# Patient Record
Sex: Female | Born: 1945 | Race: White | Hispanic: No
Health system: Southern US, Community
[De-identification: ages and names within clinical notes are randomized; demographics above are authoritative.]

---

## 2018-07-08 ENCOUNTER — Observation Stay (HOSPITAL_COMMUNITY)
Admission: EM | Admit: 2018-07-08 | Discharge: 2018-07-08 | Disposition: A | Discharge status: Other Acute Inpatient Hospital | Payer: Medicare Other | Attending: Internal Medicine | Admitting: Internal Medicine

## 2018-07-08 ENCOUNTER — Emergency Department (HOSPITAL_COMMUNITY): Payer: Medicare Other

## 2018-07-08 DIAGNOSIS — Z9889 Other specified postprocedural states: Secondary | ICD-10-CM | POA: Insufficient documentation

## 2018-07-08 DIAGNOSIS — Z7982 Long term (current) use of aspirin: Secondary | ICD-10-CM | POA: Diagnosis not present

## 2018-07-08 DIAGNOSIS — N99840 Postprocedural hematoma of a genitourinary system organ or structure following a genitourinary system procedure: Secondary | ICD-10-CM | POA: Diagnosis not present

## 2018-07-08 DIAGNOSIS — Z95828 Presence of other vascular implants and grafts: Secondary | ICD-10-CM | POA: Insufficient documentation

## 2018-07-08 DIAGNOSIS — Z880 Allergy status to penicillin: Secondary | ICD-10-CM | POA: Diagnosis not present

## 2018-07-08 DIAGNOSIS — D649 Anemia, unspecified: Secondary | ICD-10-CM | POA: Diagnosis not present

## 2018-07-08 DIAGNOSIS — Z7901 Long term (current) use of anticoagulants: Secondary | ICD-10-CM | POA: Diagnosis not present

## 2018-07-08 DIAGNOSIS — E669 Obesity, unspecified: Secondary | ICD-10-CM | POA: Diagnosis not present

## 2018-07-08 DIAGNOSIS — N9489 Other specified conditions associated with female genital organs and menstrual cycle: Secondary | ICD-10-CM

## 2018-07-08 DIAGNOSIS — Z6841 Body Mass Index (BMI) 40.0 and over, adult: Secondary | ICD-10-CM | POA: Diagnosis not present

## 2018-07-08 LAB — URINALYSIS, ROUTINE W REFLEX MICROSCOPIC
Bilirubin Urine: NEGATIVE
Glucose, UA: NEGATIVE mg/dL
Ketones, ur: NEGATIVE mg/dL
Nitrite: NEGATIVE
Protein, ur: NEGATIVE mg/dL
Specific Gravity, Urine: 1.005 (ref 1.005–1.030)
pH: 6 (ref 5.0–8.0)

## 2018-07-08 LAB — BASIC METABOLIC PANEL
Anion gap: 10 (ref 5–15)
BUN: 30 mg/dL — ABNORMAL HIGH (ref 8–23)
CO2: 22 mmol/L (ref 22–32)
Calcium: 7.7 mg/dL — ABNORMAL LOW (ref 8.9–10.3)
Chloride: 101 mmol/L (ref 98–111)
Creatinine, Ser: 1.29 mg/dL — ABNORMAL HIGH (ref 0.44–1.00)
GFR calc Af Amer: 47 mL/min — ABNORMAL LOW (ref >60)
GFR calc non Af Amer: 41 mL/min — ABNORMAL LOW (ref >60)
Glucose, Bld: 81 mg/dL (ref 70–99)
Potassium: 3.7 mmol/L (ref 3.5–5.1)
Sodium: 133 mmol/L — ABNORMAL LOW (ref 135–145)

## 2018-07-08 LAB — CBC WITH DIFFERENTIAL/PLATELET
Abs Immature Granulocytes: 0.1 10*3/uL (ref 0.0–0.1)
Basophils Absolute: 0 10*3/uL (ref 0.0–0.1)
Basophils Relative: 0 %
Eosinophils Absolute: 0.5 10*3/uL (ref 0.0–0.7)
Eosinophils Relative: 7 %
HCT: 17.5 % — ABNORMAL LOW (ref 36.0–46.0)
Hemoglobin: 5.6 g/dL — CL (ref 12.0–15.0)
Immature Granulocytes: 1 %
Lymphocytes Relative: 12 %
Lymphs Abs: 0.8 10*3/uL (ref 0.7–4.0)
MCH: 28.9 pg (ref 26.0–34.0)
MCHC: 32 g/dL (ref 30.0–36.0)
MCV: 90.2 fL (ref 78.0–100.0)
Monocytes Absolute: 0.7 10*3/uL (ref 0.1–1.0)
Monocytes Relative: 11 %
Neutro Abs: 4.8 10*3/uL (ref 1.7–7.7)
Neutrophils Relative %: 69 %
Platelets: 213 10*3/uL (ref 150–400)
RBC: 1.94 MIL/uL — ABNORMAL LOW (ref 3.87–5.11)
RDW: 14.6 % (ref 11.5–15.5)
WBC: 6.8 10*3/uL (ref 4.0–10.5)

## 2018-07-08 LAB — ABO/RH: ABO/RH(D): A POS

## 2018-07-08 LAB — PREPARE RBC (CROSSMATCH)

## 2018-07-08 MED ORDER — SODIUM CHLORIDE 0.9% IV SOLUTION: Freq: Once | INTRAVENOUS | Status: DC

## 2018-07-08 MED ORDER — ASPIRIN 325 MG PO TABS
325.00 | ORAL_TABLET | ORAL | Status: DC
Start: 2018-07-07 — End: 2018-07-08

## 2018-07-08 MED ORDER — SENNOSIDES-DOCUSATE SODIUM 8.6-50 MG PO TABS
1.00 | ORAL_TABLET | ORAL | Status: DC
Start: 2018-07-06 — End: 2018-07-08

## 2018-07-08 MED ORDER — ONDANSETRON HCL 4 MG/2ML IJ SOLN
4.00 | INTRAMUSCULAR | Status: DC
Start: ? — End: 2018-07-08

## 2018-07-08 MED ORDER — IOHEXOL 300 MG/ML  SOLN
100.0000 mL | Freq: Once | INTRAMUSCULAR | Status: AC | PRN
  Administered 2018-07-08: 100 mL via INTRAVENOUS

## 2018-07-08 MED ORDER — SODIUM CHLORIDE 0.9 % IJ SOLN
10.00 | INTRAMUSCULAR | Status: DC
Start: ? — End: 2018-07-08

## 2018-07-08 MED ORDER — POLYETHYLENE GLYCOL 3350 17 G PO PACK
17.00 g | PACK | ORAL | Status: DC
Start: 2018-07-07 — End: 2018-07-08

## 2018-07-08 MED ORDER — GENERIC EXTERNAL MEDICATION
5.00 | Status: DC
Start: ? — End: 2018-07-08

## 2018-07-08 MED ORDER — ATORVASTATIN CALCIUM 10 MG PO TABS
10.00 | ORAL_TABLET | ORAL | Status: DC
Start: 2018-07-06 — End: 2018-07-08

## 2018-07-08 MED ORDER — GABAPENTIN 300 MG PO CAPS
300.00 | ORAL_CAPSULE | ORAL | Status: DC
Start: 2018-07-06 — End: 2018-07-08

## 2018-07-08 MED ORDER — METOPROLOL TARTRATE 50 MG PO TABS
50.00 | ORAL_TABLET | ORAL | Status: DC
Start: 2018-07-06 — End: 2018-07-08

## 2018-07-08 MED ORDER — HYDROMORPHONE HCL 1 MG/ML IJ SOLN
0.20 | INTRAMUSCULAR | Status: DC
Start: ? — End: 2018-07-08

## 2018-07-08 MED ORDER — ACETAMINOPHEN 325 MG PO TABS
650.00 | ORAL_TABLET | ORAL | Status: DC
Start: 2018-07-06 — End: 2018-07-08

## 2018-07-08 MED ORDER — GENERIC EXTERNAL MEDICATION
Status: DC
Start: ? — End: 2018-07-08

## 2018-07-08 MED ORDER — MORPHINE SULFATE (PF) 4 MG/ML IV SOLN
4.0000 mg | Freq: Once | INTRAVENOUS | Status: AC
  Administered 2018-07-08: 4 mg via INTRAVENOUS
  Filled 2018-07-08: qty 1

## 2018-07-08 MED ORDER — ENOXAPARIN SODIUM 120 MG/0.8ML ~~LOC~~ SOLN
1.00 | SUBCUTANEOUS | Status: DC
Start: 2018-07-06 — End: 2018-07-08

## 2018-07-08 MED ORDER — SODIUM CHLORIDE 0.9 % IJ SOLN
10.00 | INTRAMUSCULAR | Status: DC
Start: 2018-07-06 — End: 2018-07-08

## 2018-07-08 MED ORDER — CITALOPRAM HYDROBROMIDE 20 MG PO TABS
40.00 | ORAL_TABLET | ORAL | Status: DC
Start: 2018-07-07 — End: 2018-07-08

## 2018-07-08 NOTE — ED Triage Notes (Signed)
Pt [facility] via EMS [facility] with critical lab Hgb of 5.3. Pt reports hx of blood transfusion while in the hospital recently. Reports a recent abd sx where an unknown mass was removed. Right foot pain.

## 2018-07-08 NOTE — ED Provider Notes (Signed)
[facility] EMERGENCY DEPARTMENT Provider Note   CSN: [id number] Arrival date & time: 07/08/18  1615     History   Chief Complaint Chief Complaint  Patient presents with  . Abnormal Lab    HPI Sydney Frost is a 72 y.o. female.  HPI   72 year old female sent for evaluation of anemia. Reportedly hemoglobin was 5.3. Patient with over 2-week hospital stay at [facility] and discharged 2 days ago.  Very briefly summarized below.  More complete records available through care everywhere.  She was discharged to a nursing facility for rehab.  She reports that overall she feels like she has been recovering well.  Her pain has been improving.  She has been working with physical therapy.  Her energy level has been fair.  She has not noticed any overt bleeding.  She is currently on aspirin and getting Lovenox injections.  She has no acute respiratory complaints.   Admit : 7/7 - 7/23 2019 at [facility] Admitting Physician: Festus Holts, MD Admission Diagnoses:  Patient was admitted as a transfer from [facility] for urinary retention, AKI and pelvic and rectus sheath hematomas in the setting of a pelvic mass causing DVTs, necessitating anticoagulation.  18.4 x 14.2 x. 20.9 cm heterogenous mass within the right hemipelvis with compression effect leading to acute DVTs and bilateral hydronephrosis.   (06/21/2018), patient had bilateral nephrostomy tubes and an IVC filter placed.   Basline Cr 0.7-0.9 -Patient oliguric/anuric on arrival and until HD5, when UOP began improving.  - On HD 6, patient's creatinine peaked at 3.75. Cr 0.98 on discharge.  - Likely due to combination of extrinsic compression of ureters from pelvic mass/intraabdominal hematomas, venous congestion (mass compressing IVC), and an element of pre-renal volume depletion in the setting of Urosepsis.   (06/29/2018), patient was taken the the OR for an Ex Lap, TAH, BSO, resection of pelvic mass. EBL 1L. Procedure otherwise  uncomplicated. Mass benign.   Serial Hgbs requiring transfusion  -- 7.6 on 7/9, 1U pRBCs -- 6.8 on 7/11, 1U pRBC -- 7.9 on 7/16, 1U pRBC (given intra-op)   -- 7.3 on 7/20, 1U pRBCs - Intraabdominal hematomas improving on CT 7/12   No past medical history on file.  There are no active problems to display for this patient.    OB History   None      Home Medications    Prior to Admission medications   Not on File    Family History No family history on file.  Social History Social History   Tobacco Use  . Smoking status: Not on file  Substance Use Topics  . Alcohol use: Not on file  . Drug use: Not on file     Allergies   Penicillins   Review of Systems Review of Systems  All systems reviewed and negative, other than as noted in HPI.  Physical Exam Updated Vital Signs Pulse (!) 57   Temp 98.5 F (36.9 C) (Oral)   Resp 12   Ht 5\' 8"  (1.727 m)   Wt (!) 158.8 kg (350 lb)   SpO2 100%   BMI 53.22 kg/m   Physical Exam  Constitutional: She appears well-developed and well-nourished. No distress.  Laying in bed.  Appears tired but not distressed.  Obese.  HENT:  Head: Normocephalic and atraumatic.  Eyes: Conjunctivae are normal. Right eye exhibits no discharge. Left eye exhibits no discharge.  Neck: Neck supple.  Cardiovascular: Normal rate, regular rhythm and normal heart sounds. Exam reveals  no gallop and no friction rub.  No murmur heard. Pulmonary/Chest: Effort normal and breath sounds normal. No respiratory distress.  Abdominal: Soft. She exhibits no distension. There is no tenderness.  Midline abdominal incision and site of prior drain are bandaged.  Bandages are dry. Abdomen is soft.  Mild diffuse tenderness which seems appropriate given her recent history/surgery.  Genitourinary:  Genitourinary Comments: B/l nephro tubes. Foley with pale yellow urine.   Musculoskeletal: She exhibits no edema or tenderness.  Neurological: She is alert.    Skin: Skin is warm and dry.  Scattered ecchymosis to abdomen and extremities which appear to be in various stages of healing.  Psychiatric: She has a normal mood and affect. Her behavior is normal. Thought content normal.  Nursing note and vitals reviewed.    ED Treatments / Results  Labs (all labs ordered are listed, but only abnormal results are displayed) Labs Reviewed  CBC WITH DIFFERENTIAL/PLATELET - Abnormal; Notable for the following components:      Result Value   RBC 1.94 (*)    Hemoglobin 5.6 (*)    HCT 17.5 (*)    All other components within normal limits  BASIC METABOLIC PANEL - Abnormal; Notable for the following components:   Sodium 133 (*)    BUN 30 (*)    Creatinine, Ser 1.29 (*)    Calcium 7.7 (*)    GFR calc non Af Amer 41 (*)    GFR calc Af Amer 47 (*)    All other components within normal limits  URINALYSIS, ROUTINE W REFLEX MICROSCOPIC - Abnormal; Notable for the following components:   APPearance HAZY (*)    Hgb urine dipstick LARGE (*)    Leukocytes, UA MODERATE (*)    Bacteria, UA RARE (*)    All other components within normal limits  TYPE AND SCREEN  ABO/RH  PREPARE RBC (CROSSMATCH)    EKG None  Radiology Ct Abdomen Pelvis W Contrast  Result Date: 07/08/2018 CLINICAL DATA:  Anemia. EXAM: CT ABDOMEN AND PELVIS WITH CONTRAST TECHNIQUE: Multidetector CT imaging of the abdomen and pelvis was performed using the standard protocol following bolus administration of intravenous contrast. Exam is limited due to body habitus. CONTRAST:  100mL OMNIPAQUE IOHEXOL 300 MG/ML  SOLN COMPARISON:  CT scan of June 20, 2018. FINDINGS: Lower chest: No acute abnormality. Hepatobiliary: No focal liver abnormality is seen. No gallstones, gallbladder wall thickening, or biliary dilatation. Pancreas: Unremarkable. No pancreatic ductal dilatation or surrounding inflammatory changes. Spleen: Normal in size without focal abnormality. Adrenals/Urinary Tract: Adrenal glands appear  normal. There is been interval placement of bilateral percutaneous nephrostomy catheters in grossly good position. Mild bilateral hydroureteronephrosis is noted. Distal portions of the ureters are not visualized. Urinary bladder appears to be decompressed with Foley catheter, which is deviated to the left due to large pelvic hematoma. Stomach/Bowel: The stomach appears normal. There is no evidence of bowel obstruction or inflammation. The appendix is not visualized. Sigmoid diverticulosis is noted without definite inflammation. Vascular/Lymphatic: IVC filter is noted in infrarenal position. No definite adenopathy is noted. No abdominal aortic aneurysm is noted. Reproductive: Large pelvic mass noted on prior exam has been removed. Large pelvic hematoma remains. Other: Midline surgical staples are noted. Probable hematomas are seen within the anterior surgical incisions. Musculoskeletal: No acute or significant osseous findings. IMPRESSION: Status post surgical removal of pelvic mass noted on prior exam. Large pelvic hematoma is again noted which appears to be increased in size compared to prior exam. Probable hemorrhage is  seen within the midline anterior surgical incision. Critical Value/emergent results were called by telephone at the time of interpretation on 07/08/2018 at 7:38 pm to Dr. Raeford Razor , who verbally acknowledged these results. Interval placement of bilateral percutaneous nephrostomy catheters, with mild right hydroureteronephrosis seen bilaterally. Electronically Signed   By: Lupita Raider, M.D.   On: 07/08/2018 19:39    Procedures Procedures (including critical care time)  CRITICAL CARE Performed by: Raeford Razor Total critical care time: 40 minutes Critical care time was exclusive of separately billable procedures and treating other patients. Critical care was necessary to treat or prevent imminent or life-threatening deterioration. Critical care was time spent personally by me on  the following activities: development of treatment plan with patient and/or surrogate as well as nursing, discussions with consultants, evaluation of patient's response to treatment, examination of patient, obtaining history from patient or surrogate, ordering and performing treatments and interventions, ordering and review of laboratory studies, ordering and review of radiographic studies, pulse oximetry and re-evaluation of patient's condition.   Medications Ordered in ED Medications - No data to display   Initial Impression / Assessment and Plan / ED Course  I have reviewed the triage vital signs and the nursing notes.  Pertinent labs & imaging results that were available during my care of the patient were reviewed by me and considered in my medical decision making (see chart for details).     72 year old female with increasing anemia. She required transfusion of 4 units of packed red blood cells during her recent hospitalization.  Type and screen. Will verify H/H and transfuse as needed.  Recent imaging with abdominal wall and pelvic hematomas.  She is on Lovenox.  We will reimage her abdomen/pelvis to further re-assess these.  Spoke with her daughter Asher Muir, [phone], with pt's permission. Discussed initial impression and plan.   CT with enlarging hematoma. Discussed with Dr Greggory Stallion to transfer to [facility] for continuity of care reasons. Appreciate his assistance. Pt/daughter updated. PRBCs infusing. Discussed with CT to have CD of today's imaging sent with her.   Final Clinical Impressions(s) / ED Diagnoses   Final diagnoses:  Symptomatic anemia  Pelvic hematoma, female  Anticoagulated    ED Discharge Orders    None       Raeford Razor, MD 07/08/18 2103

## 2018-07-09 LAB — BPAM RBC
Blood Product Expiration Date: 201908132359
Blood Product Expiration Date: 201908132359
ISSUE DATE / TIME: 201907251934
ISSUE DATE / TIME: 201907252131
Unit Type and Rh: 6200
Unit Type and Rh: 6200

## 2018-07-09 LAB — TYPE AND SCREEN
ABO/RH(D): A POS
Antibody Screen: NEGATIVE
Unit division: 0
Unit division: 0

## 2018-07-28 ENCOUNTER — Other Ambulatory Visit (HOSPITAL_COMMUNITY): Payer: Self-pay | Admitting: Interventional Radiology

## 2018-07-28 DIAGNOSIS — Z95828 Presence of other vascular implants and grafts: Secondary | ICD-10-CM

## 2018-09-08 ENCOUNTER — Other Ambulatory Visit: Payer: Medicare Other

## 2018-11-25 ENCOUNTER — Other Ambulatory Visit: Payer: Medicare Other

## 2019-06-18 IMAGING — CT CT ABD-PELV W/ CM
2 of 5 series · 16 of 46 positions shown, 18 images · IV contrast (APPLIED)
Comparison: CT scan of June 20, 2018.

CLINICAL DATA: Anemia.

EXAM:
CT ABDOMEN AND PELVIS WITH CONTRAST
TECHNIQUE: Multidetector CT imaging of the abdomen and pelvis was performed
using the standard protocol following bolus administration of
intravenous contrast. Exam is limited due to body habitus.
CONTRAST:  100mL OMNIPAQUE IOHEXOL 300 MG/ML  SOLN

[Series 3: abd/ pelvis 5.0 i30f 2 · axial · 0.98mm/px · z∈[+845,+1325]mm · 13 of 108 slices shown, 15 images]
[im 6/108  soft-tissue]
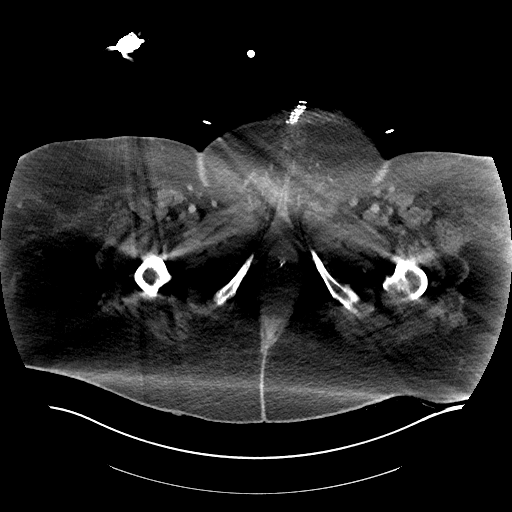
[im 6/108  bone]
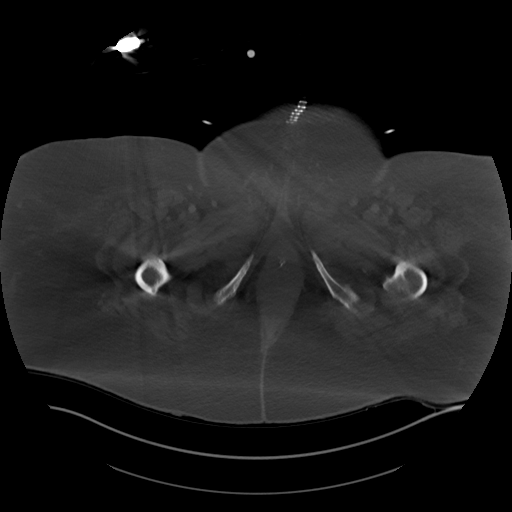
[im 17/108  soft-tissue]
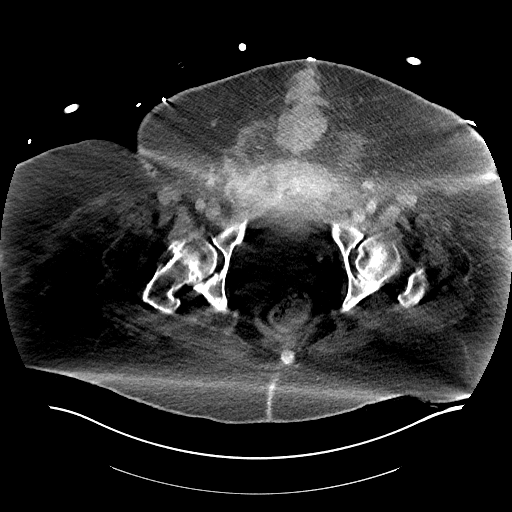
[im 23/108  soft-tissue]
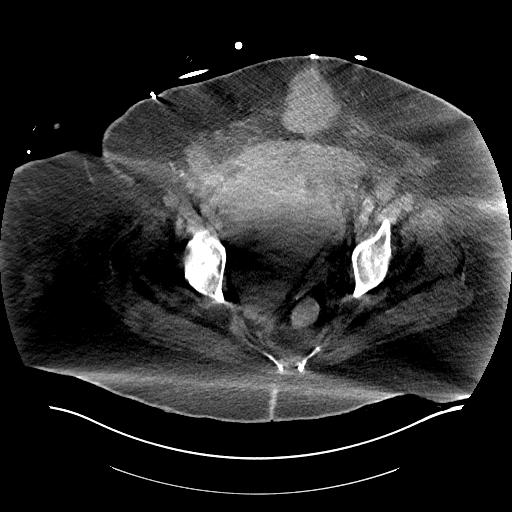
[im 29/108  soft-tissue]
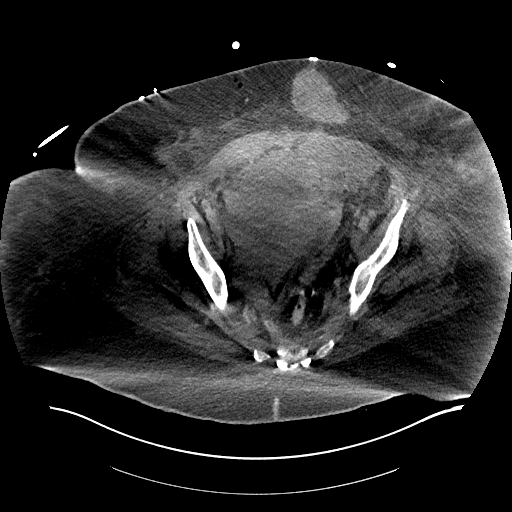
[im 40/108  soft-tissue]
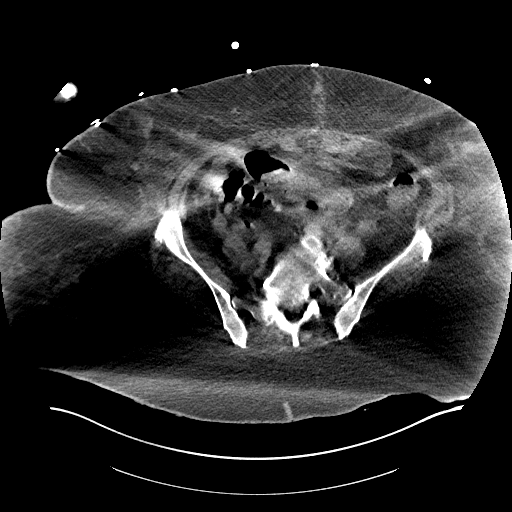
[im 46/108  soft-tissue]
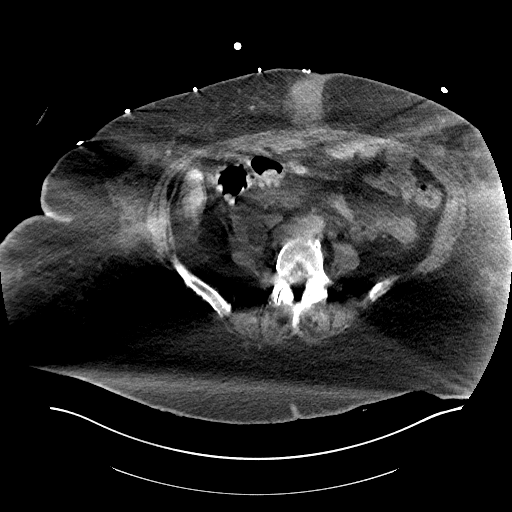
[im 57/108  soft-tissue]
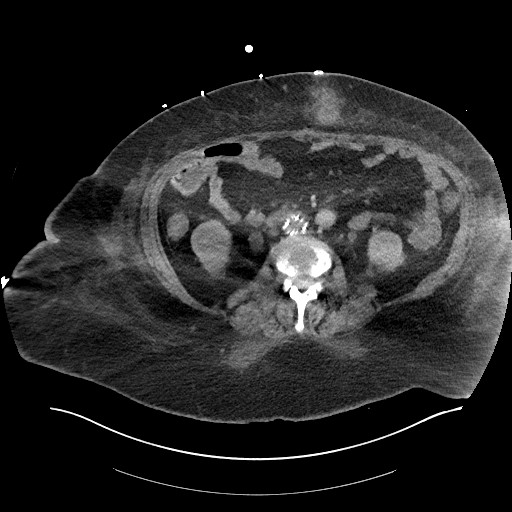
[im 62/108  soft-tissue]
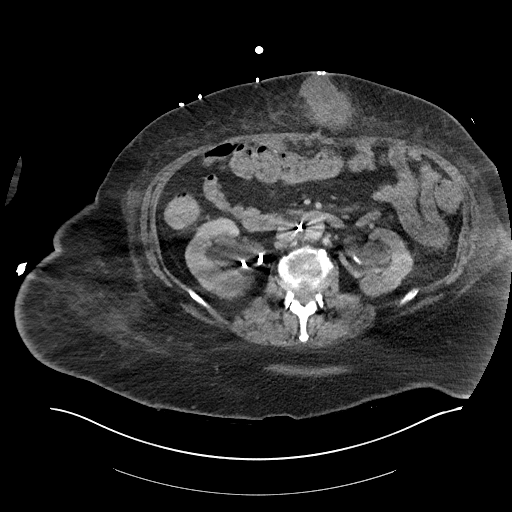
[im 68/108  soft-tissue]
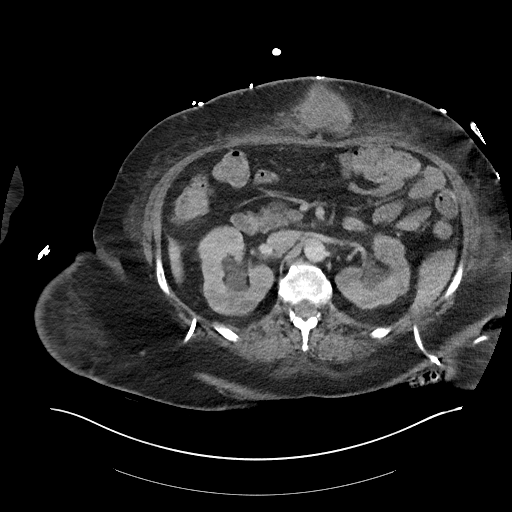
[im 68/108  bone]
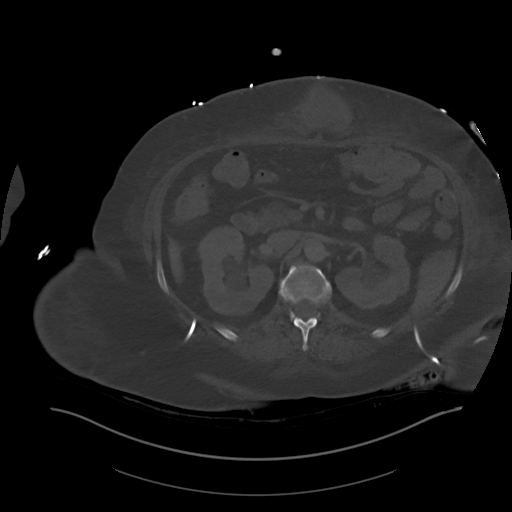
[im 79/108  soft-tissue]
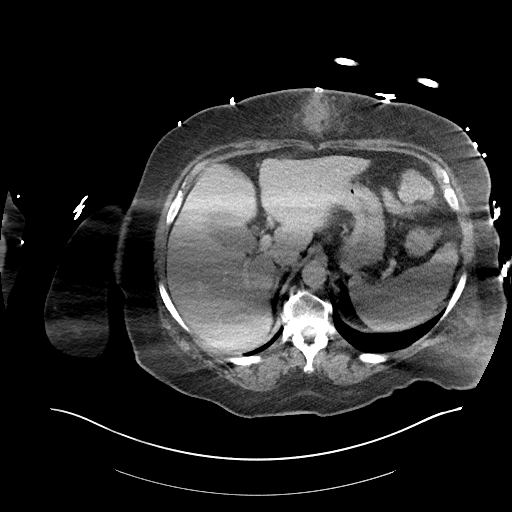
[im 85/108  soft-tissue]
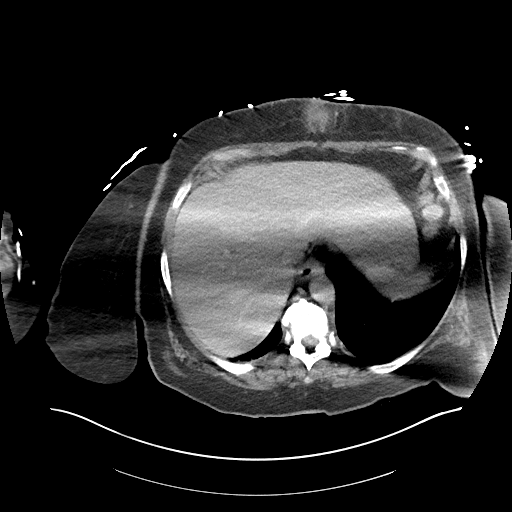
[im 91/108  soft-tissue]
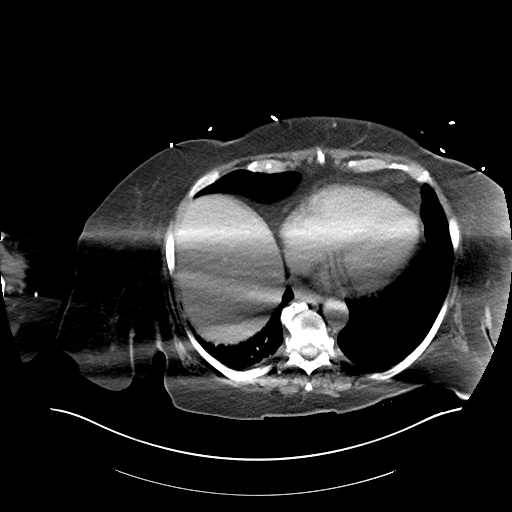
[im 102/108  soft-tissue]
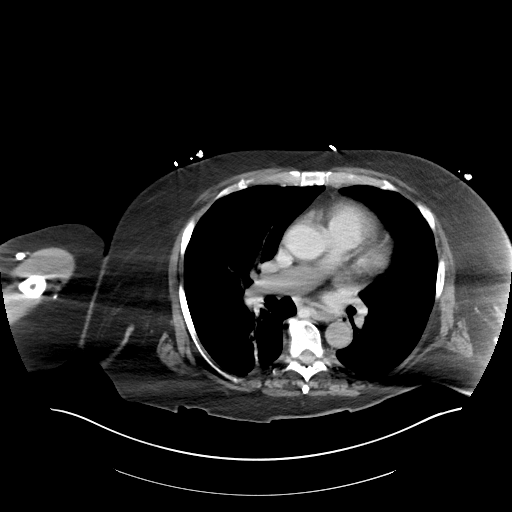

[Series 6: coronal soft tissue · coronal · 0.88mm/px · 3 of 107 slices shown]
[im 36/107  soft-tissue]
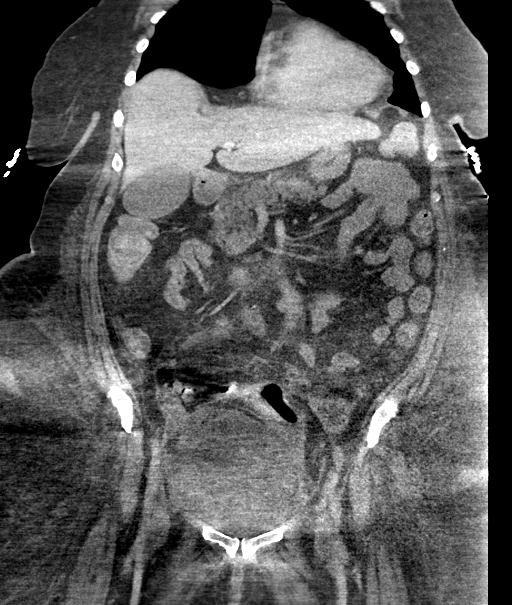
[im 48/107  soft-tissue]
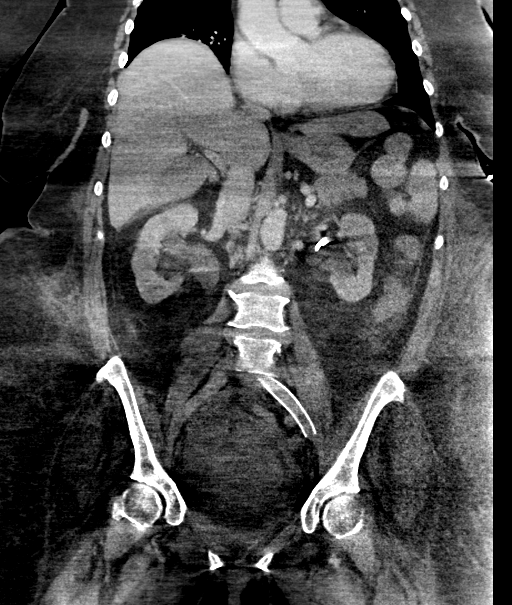
[im 59/107  soft-tissue]
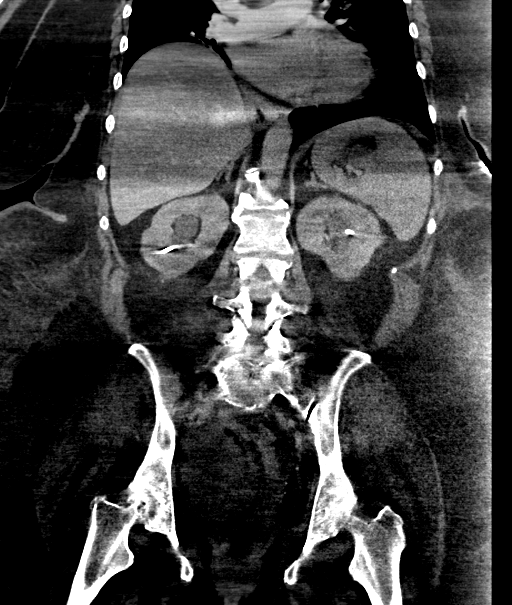

[16 of 46 positions shown; findings below may reference images not displayed]

FINDINGS: Lower chest: No acute abnormality.

Hepatobiliary: No focal liver abnormality is seen. No gallstones,
gallbladder wall thickening, or biliary dilatation.

Pancreas: Unremarkable. No pancreatic ductal dilatation or
surrounding inflammatory changes.

Spleen: Normal in size without focal abnormality.

Adrenals/Urinary Tract: Adrenal glands appear normal. There is been
interval placement of bilateral percutaneous nephrostomy catheters
in grossly good position. Mild bilateral hydroureteronephrosis is
noted. Distal portions of the ureters are not visualized. Urinary
bladder appears to be decompressed with Foley catheter, which is
deviated to the left due to large pelvic hematoma.

Stomach/Bowel: The stomach appears normal. There is no evidence of
bowel obstruction or inflammation. The appendix is not visualized.
Sigmoid diverticulosis is noted without definite inflammation.

Vascular/Lymphatic: IVC filter is noted in infrarenal position. No
definite adenopathy is noted. No abdominal aortic aneurysm is noted.

Reproductive: Large pelvic mass noted on prior exam has been
removed. Large pelvic hematoma remains.

Other: Midline surgical staples are noted. Probable hematomas are
seen within the anterior surgical incisions.

Musculoskeletal: No acute or significant osseous findings.
IMPRESSION: Status post surgical removal of pelvic mass noted on prior exam.
Large pelvic hematoma is again noted which appears to be increased
in size compared to prior exam. Probable hemorrhage is seen within
the midline anterior surgical incision. Critical Value/emergent
results were called by telephone at the time of interpretation on
07/08/2018 at [DATE] to Dr. OURARI TIGER , who verbally
acknowledged these results.

Interval placement of bilateral percutaneous nephrostomy catheters,
with mild right hydroureteronephrosis seen bilaterally.

## 2019-06-23 ENCOUNTER — Telehealth: Payer: Self-pay | Admitting: *Deleted

## 2019-06-23 NOTE — Telephone Encounter (Signed)
Called pt to sch f/u from Lysis procedure and IVC Filter this was a 3nd attempt to sch. The first time 11/23/18 per pt she was in a wheelchair and not mobile at this time wanted me to call her in a few months. 2nd attempt 02/10/19 wanted [place] to c/b in a few months. I called today and she states she has too much going on at this time and she will call me when timing is better. I will try again in a few months./vm

## 2019-09-23 ENCOUNTER — Other Ambulatory Visit: Payer: Self-pay | Admitting: Interventional Radiology

## 2019-09-23 DIAGNOSIS — Z95828 Presence of other vascular implants and grafts: Secondary | ICD-10-CM

## 2019-09-26 ENCOUNTER — Telehealth: Payer: Self-pay

## 2019-09-26 NOTE — Telephone Encounter (Signed)
Contacted Pt to schedule F/U from IVC Filter Placement pt stated that she WCB to schedule.

## 2019-12-13 ENCOUNTER — Other Ambulatory Visit: Payer: Self-pay | Admitting: Interventional Radiology

## 2019-12-13 DIAGNOSIS — Z86718 Personal history of other venous thrombosis and embolism: Secondary | ICD-10-CM

## 2020-01-10 ENCOUNTER — Other Ambulatory Visit: Payer: Medicare Other

## 2020-01-11 ENCOUNTER — Other Ambulatory Visit: Payer: Medicare Other

## 2020-04-12 ENCOUNTER — Telehealth: Payer: Self-pay

## 2020-04-12 NOTE — Telephone Encounter (Signed)
Contacted pt on 02/07/20, 02/14/20 and 04/12/20 to R/S appt [facility] & [facility] set up-Cell; Spoke w/ pt on 03/14/20 pt stated that she WCB to R/S appt but has not called to schedule.

## 2020-06-26 ENCOUNTER — Telehealth: Payer: Self-pay

## 2020-06-26 NOTE — Telephone Encounter (Signed)
Spoke w/ pt stated that she WCB to schedule f/u appt.

## 2021-02-11 ENCOUNTER — Other Ambulatory Visit (HOSPITAL_COMMUNITY): Payer: Self-pay | Admitting: Interventional Radiology

## 2021-02-11 DIAGNOSIS — Z86718 Personal history of other venous thrombosis and embolism: Secondary | ICD-10-CM
